# Patient Record
Sex: Female | Born: 2000
Health system: Southern US, Community
[De-identification: ages and names within clinical notes are randomized; demographics above are authoritative.]

---
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
## 2019-02-02 ENCOUNTER — Other Ambulatory Visit: Payer: Self-pay

## 2019-02-02 ENCOUNTER — Ambulatory Visit (HOSPITAL_COMMUNITY)
Admission: EM | Admit: 2019-02-02 | Discharge: 2019-02-02 | Disposition: A | Discharge status: Home / Self Care | Payer: BC Managed Care – PPO | Attending: Family Medicine | Admitting: Family Medicine

## 2019-02-02 ENCOUNTER — Encounter (HOSPITAL_COMMUNITY): Payer: Self-pay | Admitting: Urgent Care

## 2019-02-02 DIAGNOSIS — L089 Local infection of the skin and subcutaneous tissue, unspecified: Secondary | ICD-10-CM | POA: Diagnosis not present

## 2019-02-02 DIAGNOSIS — R21 Rash and other nonspecific skin eruption: Secondary | ICD-10-CM

## 2019-02-02 DIAGNOSIS — B86 Scabies: Secondary | ICD-10-CM

## 2019-02-02 DIAGNOSIS — S80811A Abrasion, right lower leg, initial encounter: Secondary | ICD-10-CM | POA: Diagnosis not present

## 2019-02-02 DIAGNOSIS — L298 Other pruritus: Secondary | ICD-10-CM

## 2019-02-02 MED ORDER — CEPHALEXIN 500 MG PO CAPS: 500.0000 mg | ORAL_CAPSULE | Freq: Three times a day (TID) | ORAL | 0 refills | Status: DC

## 2019-02-02 MED ORDER — IVERMECTIN 3 MG PO TABS: ORAL_TABLET | ORAL | 0 refills | Status: DC

## 2019-02-02 NOTE — Discharge Instructions (Signed)
Please change your dressing 2-3 times daily. Do not apply any ointments or creams. Each time you change your dressing, make sure you clean gently around the perimeter of the wound with gentle soap and warm water. Pat your wound dry and let it air out if possible for 1-2 hours before reapplying another dressing. For dressings you can use non-adherent dressings and [place].

## 2019-02-02 NOTE — ED Provider Notes (Signed)
  [facility]   MRN: [id number] DOB: 02/17/2000  Subjective:   Ashley Miranda is a 19 y.o. female presenting for 3-week history of persistent leg wound.  Patient states that it is still painful, weeping and swollen.  She states she suffered this abrasion 3 weeks ago.  She is also since developed itchy spots on her forearms.  Denies any other people around her having a similar rash.  She checked her home for bedbugs and did not find any.  Has used peroxide on her wound.  No current facility-administered medications for this encounter.  Current Outpatient Medications:  .  APAP-Pamabrom-Pyrilamine (MENSTRUAL PAIN RELIEF) 500-25-15 MG TABS, Take 1 tablet by mouth daily as needed (cramping)., Disp: , Rfl:  .  doxycycline (VIBRAMYCIN) 100 MG capsule, Take 1 capsule (100 mg total) by mouth 2 (two) times daily., Disp: 20 capsule, Rfl: 0 .  Multiple Vitamins-Minerals (MULTIVITAMIN WITH MINERALS) tablet, Take 1 tablet by mouth daily., Disp: , Rfl:    No Known Allergies  History reviewed. No pertinent past medical history.   History reviewed. No pertinent surgical history.  History reviewed. No pertinent family history.  Social History   Tobacco Use  . Smoking status: Never Smoker  . Smokeless tobacco: Never Used  Substance Use Topics  . Alcohol use: Not on file  . Drug use: Not on file    ROS   Objective:   Vitals: BP 115/80 (BP Location: Right Arm)   Pulse 97   Temp 98.7 F (37.1 C) (Oral)   Resp 16   Wt 120 lb (54.4 kg)   LMP 01/25/2019   SpO2 100%   Physical Exam Constitutional:      General: She is not in acute distress.    Appearance: Normal appearance. She is well-developed. She is not ill-appearing, toxic-appearing or diaphoretic.  HENT:     Head: Normocephalic and atraumatic.     Nose: Nose normal.     Mouth/Throat:     Mouth: Mucous membranes are moist.     Pharynx: Oropharynx is clear.  Eyes:     General: No scleral icterus.    Extraocular  Movements: Extraocular movements intact.     Pupils: Pupils are equal, round, and reactive to light.  Cardiovascular:     Rate and Rhythm: Normal rate.  Pulmonary:     Effort: Pulmonary effort is normal.  Skin:    General: Skin is warm and dry.     Findings: Rash (Very obvious burrows in linear distribution across her forearms bilaterally starting at the wrist and extending all the way to her elbow) present.       Neurological:     General: No focal deficit present.     Mental Status: She is alert and oriented to person, place, and time.  Psychiatric:        Mood and Affect: Mood normal.        Behavior: Behavior normal.         Assessment and Plan :   1. Infected abrasion   2. Scabies     Will cover infected abrasion with Keflex.  Counseled on wound care.  Patient is to start ivermectin to address scabies. Counseled patient on potential for adverse effects with medications prescribed/recommended today, ER and return-to-clinic precautions discussed, patient verbalized understanding.    Wallis Bamberg, [place] 02/02/19 1607

## 2019-02-02 NOTE — ED Triage Notes (Signed)
Pt states she had a bruise on her right lower leg and now its weeping and swollen. Pt states it has a  a rash around the bruise. Pt states she been getting rash on her arms as well. This has been going on for 3 weeks.

## 2019-05-25 DIAGNOSIS — Z20822 Contact with and (suspected) exposure to covid-19: Secondary | ICD-10-CM | POA: Diagnosis not present

## 2023-03-02 ENCOUNTER — Ambulatory Visit: Payer: Managed Care, Other (non HMO) | Admitting: Internal Medicine

## 2023-03-11 ENCOUNTER — Other Ambulatory Visit: Payer: Self-pay

## 2023-03-11 ENCOUNTER — Encounter: Payer: Self-pay | Admitting: Internal Medicine

## 2023-03-11 ENCOUNTER — Ambulatory Visit (INDEPENDENT_AMBULATORY_CARE_PROVIDER_SITE_OTHER): Payer: Managed Care, Other (non HMO) | Admitting: Internal Medicine

## 2023-03-11 VITALS — BP 100/64 | HR 95 | Temp 98.6°F | Resp 24 | Ht 58.5 in | Wt 144.2 lb

## 2023-03-11 DIAGNOSIS — R053 Chronic cough: Secondary | ICD-10-CM | POA: Diagnosis not present

## 2023-03-11 DIAGNOSIS — J3089 Other allergic rhinitis: Secondary | ICD-10-CM | POA: Diagnosis not present

## 2023-03-11 MED ORDER — FLUTICASONE PROPIONATE 50 MCG/ACT NA SUSP: 2.0000 | Freq: Every day | NASAL | 5 refills | Status: AC

## 2023-03-11 NOTE — Progress Notes (Signed)
NEW PATIENT  Date of Service/Encounter:  03/11/23  Consult requested by: Inc, [facility]   Subjective:   Ashley Miranda (DOB: 10-28-2000) is a 23 y.o. female who presents to the clinic on 03/11/2023 with a chief complaint of Cough (States she has mold in apartment maybe causing coughing nasal congestion ) and Nasal Congestion .    History obtained from: chart review and patient.  Chronic Cough: Recalls around August, getting really sick with cough/wheezing/congestion/drainage/mucous production.  Went to urgent care and dx with URI and given inhaler/prednisone.  Had some relief but has had on and off cough since then.  Noted to have a water leak in the apartment and also mold around the windows.  Has felt lots of post nasal drip, sniffling, throat clearing and continued cough.  Albuterol inhaler has not helped so she is not using it.  Denies history of asthma.    Rhinitis:  Started last year. Symptoms include: nasal congestion, drainage, throat clearing, cough  Occurs seasonally-Fall/Winter  Potential triggers: mold in apartment  Treatments tried:  Zyrtec helps somewhat  Previous allergy testing: no History of sinus surgery: no Nonallergic triggers: none    Reviewed:  02/04/2023: seen by Chianne PA for cough, thought to be form allergies.  Started on Flonase/Zyrtec.   11/17/2022: seen by Jean Rosenthal NP for SOB/wheeze/cough.  Decreased breath sounds at bases. Given albuterol, Zyrtec, prednisone for 5 days.    Past Medical History: History reviewed. No pertinent past medical history.  Past Surgical History: History reviewed. No pertinent surgical history.  Family History: Family History  Problem Relation Age of Onset   Asthma Mother    Allergic rhinitis Mother    Asthma Sister     Social History:  Flooring in bedroom: carpet Pets: dog Tobacco use/exposure: none Job: HR coordinator   Medication List:  Allergies as of 03/11/2023   No Known  Allergies      Medication List        Accurate as of March 11, 2023  3:44 PM. If you have any questions, ask your nurse or doctor.          STOP taking these medications    cephALEXin 500 MG capsule Commonly known as: KEFLEX Stopped by: Birder Robson   doxycycline 100 MG capsule Commonly known as: VIBRAMYCIN Stopped by: Birder Robson   ivermectin 3 MG Tabs tablet Commonly known as: STROMECTOL Stopped by: Birder Robson   Menstrual Pain Relief 500-25-15 MG Tabs Generic drug: APAP-Pamabrom-Pyrilamine Stopped by: Birder Robson   multivitamin with minerals tablet Stopped by: Birder Robson       TAKE these medications    albuterol 108 (90 Base) MCG/ACT inhaler Commonly known as: VENTOLIN HFA Inhale 2 puffs into the lungs every 6 (six) hours as needed.   cetirizine 10 MG chewable tablet Commonly known as: ZYRTEC Chew 10 mg by mouth daily.   fluticasone 50 MCG/ACT nasal spray Commonly known as: FLONASE Place 2 sprays into both nostrils daily. Started by: Birder Robson         REVIEW OF SYSTEMS: Pertinent positives and negatives discussed in HPI.   Objective:   Physical Exam: BP 100/64   Pulse 95   Temp 98.6 F (37 C)   Resp (!) 24   Ht 4' 10.5" (1.486 m)   Wt 144 lb 3.2 oz (65.4 kg)   SpO2 97%   BMI 29.62 kg/m  Body mass index is 29.62 kg/m. GEN: alert, well  developed HEENT: clear conjunctiva,  nose with + mild inferior turbinate hypertrophy, pink nasal mucosa, + clear rhinorrhea, + cobblestoning HEART: regular rate and rhythm, no murmur LUNGS: clear to auscultation bilaterally, no coughing, unlabored respiration ABDOMEN: soft, non distended  SKIN: no rashes or lesions  Spirometry:  Tracings reviewed. Her effort: Good reproducible efforts. FVC: 2.91L, 111% predicted FEV1: 2.4L, 104% predicted FEV1/FVC ratio: 82% Interpretation: Spirometry consistent with normal pattern.  Please see scanned spirometry results for  details.   Assessment:   1. Other allergic rhinitis   2. Chronic cough     Plan/Recommendations:  Chronic Cough - Normal spirometry, no response to Albuterol, no history of asthma.  Low suspicion for asthma. - Likely related to post nasal drip.    Other Allergic Rhinitis: - Due to turbinate hypertrophy, seasonal symptoms, chronic cough with post nasal drip and unresponsive to over the counter meds, will perform skin testing to identify aeroallergen triggers.   - Use nasal saline rinses before nose sprays such as with Neilmed Sinus Rinse.  Use distilled water.   - Use Flonase 2 sprays each nostril daily. Aim upward and outward. - Use Zyrtec 10 mg daily.  - Hold all anti-histamines (Xyzal, Allegra, Zyrtec, Claritin, Benadryl, Pepcid) 3 days prior to next visit.    Follow up: 2/20 at 10 AM for skin testing 1-55, IDs okay    Alesia Morin, MD [facility]

## 2023-03-11 NOTE — Patient Instructions (Addendum)
Chronic Cough - Normal spirometry, no response to Albuterol, no history of asthma.  Low suspicion for asthma. - Likely related to post nasal drip.    Other Allergic Rhinitis: - Use nasal saline rinses before nose sprays such as with Neilmed Sinus Rinse.  Use distilled water.   - Use Flonase 2 sprays each nostril daily. Aim upward and outward. - Use Zyrtec 10 mg daily.  - Hold all anti-histamines (Xyzal, Allegra, Zyrtec, Claritin, Benadryl, Pepcid) 3 days prior to next visit.    Follow up: 2/20 at 10 AM for skin testing 1-55

## 2023-03-18 ENCOUNTER — Ambulatory Visit: Payer: Managed Care, Other (non HMO) | Admitting: Internal Medicine
